# Patient Record
Sex: Female | Born: 1995 | Race: White | Hispanic: No | Marital: Single | Smoking: Current every day smoker
Health system: Southern US, Community
[De-identification: ages and names within clinical notes are randomized; demographics above are authoritative.]

## PROBLEM LIST (undated history)

## (undated) DIAGNOSIS — R197 Diarrhea, unspecified: Secondary | ICD-10-CM

## (undated) DIAGNOSIS — F329 Major depressive disorder, single episode, unspecified: Secondary | ICD-10-CM

## (undated) DIAGNOSIS — K589 Irritable bowel syndrome without diarrhea: Secondary | ICD-10-CM

## (undated) DIAGNOSIS — F32A Depression, unspecified: Secondary | ICD-10-CM

## (undated) DIAGNOSIS — F419 Anxiety disorder, unspecified: Secondary | ICD-10-CM

## (undated) HISTORY — DX: Anxiety disorder, unspecified: F41.9

## (undated) HISTORY — DX: Major depressive disorder, single episode, unspecified: F32.9

## (undated) HISTORY — DX: Irritable bowel syndrome without diarrhea: K58.9

## (undated) HISTORY — DX: Depression, unspecified: F32.A

## (undated) HISTORY — DX: Diarrhea, unspecified: R19.7

## (undated) HISTORY — DX: Irritable bowel syndrome, unspecified: K58.9

## (undated) HISTORY — PX: TONSILLECTOMY: SUR1361

---
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
## 2015-08-12 ENCOUNTER — Encounter: Payer: Self-pay | Admitting: Emergency Medicine

## 2015-08-12 ENCOUNTER — Emergency Department
Admission: EM | Admit: 2015-08-12 | Discharge: 2015-08-12 | Disposition: A | Discharge status: Home / Self Care | Attending: Emergency Medicine | Admitting: Emergency Medicine

## 2015-08-12 ENCOUNTER — Emergency Department

## 2015-08-12 DIAGNOSIS — S4992XA Unspecified injury of left shoulder and upper arm, initial encounter: Secondary | ICD-10-CM | POA: Diagnosis not present

## 2015-08-12 DIAGNOSIS — Y998 Other external cause status: Secondary | ICD-10-CM | POA: Insufficient documentation

## 2015-08-12 DIAGNOSIS — Z793 Long term (current) use of hormonal contraceptives: Secondary | ICD-10-CM | POA: Diagnosis not present

## 2015-08-12 DIAGNOSIS — Z880 Allergy status to penicillin: Secondary | ICD-10-CM | POA: Insufficient documentation

## 2015-08-12 DIAGNOSIS — S20212A Contusion of left front wall of thorax, initial encounter: Secondary | ICD-10-CM | POA: Diagnosis not present

## 2015-08-12 DIAGNOSIS — S161XXA Strain of muscle, fascia and tendon at neck level, initial encounter: Secondary | ICD-10-CM | POA: Diagnosis not present

## 2015-08-12 DIAGNOSIS — F419 Anxiety disorder, unspecified: Secondary | ICD-10-CM | POA: Insufficient documentation

## 2015-08-12 DIAGNOSIS — Z7952 Long term (current) use of systemic steroids: Secondary | ICD-10-CM | POA: Insufficient documentation

## 2015-08-12 DIAGNOSIS — Z79899 Other long term (current) drug therapy: Secondary | ICD-10-CM | POA: Insufficient documentation

## 2015-08-12 DIAGNOSIS — S199XXA Unspecified injury of neck, initial encounter: Secondary | ICD-10-CM | POA: Diagnosis present

## 2015-08-12 DIAGNOSIS — S79912A Unspecified injury of left hip, initial encounter: Secondary | ICD-10-CM | POA: Diagnosis not present

## 2015-08-12 DIAGNOSIS — Y9389 Activity, other specified: Secondary | ICD-10-CM | POA: Diagnosis not present

## 2015-08-12 DIAGNOSIS — F329 Major depressive disorder, single episode, unspecified: Secondary | ICD-10-CM | POA: Insufficient documentation

## 2015-08-12 DIAGNOSIS — Y92481 Parking lot as the place of occurrence of the external cause: Secondary | ICD-10-CM | POA: Insufficient documentation

## 2015-08-12 MED ORDER — TRAMADOL HCL 50 MG PO TABS
50.0000 mg | ORAL_TABLET | Freq: Once | ORAL | Status: AC
  Administered 2015-08-12: 50 mg via ORAL
  Filled 2015-08-12: qty 1

## 2015-08-12 MED ORDER — IBUPROFEN 800 MG PO TABS: 800.0000 mg | ORAL_TABLET | Freq: Three times a day (TID) | ORAL | Status: DC | PRN

## 2015-08-12 MED ORDER — IBUPROFEN 800 MG PO TABS
800.0000 mg | ORAL_TABLET | Freq: Once | ORAL | Status: AC
  Administered 2015-08-12: 800 mg via ORAL
  Filled 2015-08-12: qty 1

## 2015-08-12 MED ORDER — TRAMADOL HCL 50 MG PO TABS: 50.0000 mg | ORAL_TABLET | Freq: Four times a day (QID) | ORAL | Status: DC | PRN

## 2015-08-12 MED ORDER — CYCLOBENZAPRINE HCL 10 MG PO TABS: 10.0000 mg | ORAL_TABLET | Freq: Three times a day (TID) | ORAL | Status: DC | PRN
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
## 2015-08-12 NOTE — ED Notes (Signed)
AAOx3.  Skin warm and dry.  Ambulates with easy and steady.  Moving all extremities equally and strong.  NAD
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
## 2015-08-12 NOTE — ED Notes (Signed)
Patient transported to X-ray 

## 2015-08-16 ENCOUNTER — Encounter: Payer: Self-pay | Admitting: Emergency Medicine

## 2015-08-16 ENCOUNTER — Emergency Department
Admission: EM | Admit: 2015-08-16 | Discharge: 2015-08-16 | Disposition: A | Discharge status: Home / Self Care | Attending: Emergency Medicine | Admitting: Emergency Medicine

## 2015-08-16 ENCOUNTER — Emergency Department

## 2015-08-16 DIAGNOSIS — S20212A Contusion of left front wall of thorax, initial encounter: Secondary | ICD-10-CM | POA: Diagnosis not present

## 2015-08-16 DIAGNOSIS — Z793 Long term (current) use of hormonal contraceptives: Secondary | ICD-10-CM | POA: Insufficient documentation

## 2015-08-16 DIAGNOSIS — Z7952 Long term (current) use of systemic steroids: Secondary | ICD-10-CM | POA: Diagnosis not present

## 2015-08-16 DIAGNOSIS — S7002XA Contusion of left hip, initial encounter: Secondary | ICD-10-CM | POA: Diagnosis not present

## 2015-08-16 DIAGNOSIS — Z791 Long term (current) use of non-steroidal anti-inflammatories (NSAID): Secondary | ICD-10-CM | POA: Insufficient documentation

## 2015-08-16 DIAGNOSIS — Y9389 Activity, other specified: Secondary | ICD-10-CM | POA: Insufficient documentation

## 2015-08-16 DIAGNOSIS — Y998 Other external cause status: Secondary | ICD-10-CM | POA: Diagnosis not present

## 2015-08-16 DIAGNOSIS — S0990XA Unspecified injury of head, initial encounter: Secondary | ICD-10-CM | POA: Insufficient documentation

## 2015-08-16 DIAGNOSIS — Z720 Tobacco use: Secondary | ICD-10-CM | POA: Insufficient documentation

## 2015-08-16 DIAGNOSIS — S299XXA Unspecified injury of thorax, initial encounter: Secondary | ICD-10-CM | POA: Diagnosis present

## 2015-08-16 DIAGNOSIS — S29012A Strain of muscle and tendon of back wall of thorax, initial encounter: Secondary | ICD-10-CM | POA: Insufficient documentation

## 2015-08-16 DIAGNOSIS — Z880 Allergy status to penicillin: Secondary | ICD-10-CM | POA: Diagnosis not present

## 2015-08-16 DIAGNOSIS — S29019A Strain of muscle and tendon of unspecified wall of thorax, initial encounter: Secondary | ICD-10-CM

## 2015-08-16 DIAGNOSIS — Y9289 Other specified places as the place of occurrence of the external cause: Secondary | ICD-10-CM | POA: Diagnosis not present

## 2015-08-16 DIAGNOSIS — Z79899 Other long term (current) drug therapy: Secondary | ICD-10-CM | POA: Insufficient documentation

## 2015-08-16 MED ORDER — TRAMADOL HCL 50 MG PO TABS
100.0000 mg | ORAL_TABLET | Freq: Once | ORAL | Status: AC
  Administered 2015-08-16: 100 mg via ORAL

## 2015-08-16 MED ORDER — TRAMADOL HCL 50 MG PO TABS
ORAL_TABLET | ORAL | Status: AC
  Administered 2015-08-16: 100 mg via ORAL
  Filled 2015-08-16: qty 2
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
## 2015-08-16 NOTE — ED Notes (Signed)
Pt returned from xray

## 2015-08-16 NOTE — ED Notes (Signed)
Pt taken to xray 

## 2015-08-31 ENCOUNTER — Ambulatory Visit: Payer: Self-pay | Admitting: Family Medicine

## 2016-01-16 ENCOUNTER — Encounter (HOSPITAL_COMMUNITY): Payer: Self-pay | Admitting: Emergency Medicine

## 2016-01-16 ENCOUNTER — Emergency Department (HOSPITAL_COMMUNITY): Payer: Self-pay

## 2016-01-16 ENCOUNTER — Emergency Department (HOSPITAL_COMMUNITY)
Admission: EM | Admit: 2016-01-16 | Discharge: 2016-01-16 | Disposition: A | Discharge status: Home / Self Care | Payer: Self-pay | Attending: Emergency Medicine | Admitting: Emergency Medicine

## 2016-01-16 DIAGNOSIS — F17210 Nicotine dependence, cigarettes, uncomplicated: Secondary | ICD-10-CM | POA: Insufficient documentation

## 2016-01-16 DIAGNOSIS — F329 Major depressive disorder, single episode, unspecified: Secondary | ICD-10-CM | POA: Insufficient documentation

## 2016-01-16 DIAGNOSIS — J189 Pneumonia, unspecified organism: Secondary | ICD-10-CM | POA: Insufficient documentation

## 2016-01-16 MED ORDER — ALBUTEROL SULFATE HFA 108 (90 BASE) MCG/ACT IN AERS
2.0000 | INHALATION_SPRAY | Freq: Once | RESPIRATORY_TRACT | Status: AC
  Administered 2016-01-16: 2 via RESPIRATORY_TRACT
  Filled 2016-01-16: qty 6.7

## 2016-01-16 MED ORDER — HYDROCOD POLST-CPM POLST ER 10-8 MG/5ML PO SUER
5.0000 mL | Freq: Once | ORAL | Status: AC
Start: 2016-01-16 — End: 2016-01-16
  Administered 2016-01-16: 5 mL via ORAL
  Filled 2016-01-16: qty 5

## 2016-01-16 MED ORDER — HYDROCODONE-HOMATROPINE 5-1.5 MG/5ML PO SYRP: 5.0000 mL | ORAL_SOLUTION | Freq: Four times a day (QID) | ORAL | Status: DC | PRN

## 2016-01-16 MED ORDER — CIPROFLOXACIN HCL 500 MG PO TABS: 500.0000 mg | ORAL_TABLET | Freq: Two times a day (BID) | ORAL | Status: DC

## 2016-01-16 MED ORDER — CIPROFLOXACIN HCL 250 MG PO TABS
500.0000 mg | ORAL_TABLET | Freq: Once | ORAL | Status: AC
  Administered 2016-01-16: 500 mg via ORAL
  Filled 2016-01-16: qty 2

## 2016-01-16 MED ORDER — PROMETHAZINE HCL 12.5 MG PO TABS
12.5000 mg | ORAL_TABLET | Freq: Once | ORAL | Status: AC
  Administered 2016-01-16: 12.5 mg via ORAL
  Filled 2016-01-16: qty 1

## 2016-01-16 MED ORDER — DEXAMETHASONE 4 MG PO TABS: 4.0000 mg | ORAL_TABLET | Freq: Two times a day (BID) | ORAL | Status: DC

## 2016-01-16 MED ORDER — PREDNISONE 50 MG PO TABS
60.0000 mg | ORAL_TABLET | Freq: Once | ORAL | Status: AC
  Administered 2016-01-16: 60 mg via ORAL
  Filled 2016-01-16: qty 1

## 2016-01-16 MED ORDER — IPRATROPIUM-ALBUTEROL 0.5-2.5 (3) MG/3ML IN SOLN
3.0000 mL | Freq: Once | RESPIRATORY_TRACT | Status: AC
  Administered 2016-01-16: 3 mL via RESPIRATORY_TRACT
  Filled 2016-01-16: qty 3

## 2016-01-16 NOTE — ED Notes (Signed)
Pt states she has had a cough with yellow sputum intermittently for a month.

## 2016-01-16 NOTE — Discharge Instructions (Signed)
Your x-ray reveals left lung pneumonia. Please refrain from all smoking. Use Cipro and Decadron 2 times daily with food until all taken. Use 2 puffs of albuterol every 4 hours. Use Hycodan for cough. This medication may cause drowsiness, please use with caution. Please use Tylenol or ibuprofen for fever and general aching. Community-Acquired Pneumonia, Adult Pneumonia is an infection of the lungs. One type of pneumonia can happen while a person is in a hospital. A different type can happen when a person is not in a hospital (community-acquired pneumonia). It is easy for this kind to spread from person to person. It can spread to you if you breathe near an infected person who coughs or sneezes. Some symptoms include:  A dry cough.  A wet (productive) cough.  Fever.  Sweating.  Chest pain. HOME CARE  Take over-the-counter and prescription medicines only as told by your doctor.  Only take cough medicine if you are losing sleep.  If you were prescribed an antibiotic medicine, take it as told by your doctor. Do not stop taking the antibiotic even if you start to feel better.  Sleep with your head and neck raised (elevated). You can do this by putting a few pillows under your head, or you can sleep in a recliner.  Do not use tobacco products. These include cigarettes, chewing tobacco, and e-cigarettes. If you need help quitting, ask your doctor.  Drink enough water to keep your pee (urine) clear or pale yellow. A shot (vaccine) can help prevent pneumonia. Shots are often suggested for:  People older than 20 years of age.  People older than 20 years of age:  Who are having cancer treatment.  Who have long-term (chronic) lung disease.  Who have problems with their body's defense system (immune system). You may also prevent pneumonia if you take these actions:  Get the flu (influenza) shot every year.  Go to the dentist as often as told.  Wash your hands often. If soap and water are  not available, use hand sanitizer. GET HELP IF:  You have a fever.  You lose sleep because your cough medicine does not help. GET HELP RIGHT AWAY IF:  You are short of breath and it gets worse.  You have more chest pain.  Your sickness gets worse. This is very serious if:  You are an older adult.  Your body's defense system is weak.  You cough up blood.   This information is not intended to replace advice given to you by your health care provider. Make sure you discuss any questions you have with your health care provider.   Document Released: 03/31/2008 Document Revised: 07/04/2015 Document Reviewed: 02/07/2015 Elsevier Interactive Patient Education [employer]2016 Elsevier Inc.

## 2016-01-16 NOTE — ED Provider Notes (Signed)
CSN: [id number]     Arrival date & time 01/16/16  1727 History   First MD Initiated Contact with Patient 01/16/16 1945     Chief Complaint  Patient presents with  . Cough     (Consider location/radiation/quality/duration/timing/severity/associated sxs/prior Treatment) HPI Comments: Patient is a 20 year old female who presents to the emergency department with complaint of productive cough and left rib area pain.  The patient states that she has been sick off and on for months. In the last 3 days she has been coughing to the extent of having left rib area pain. She states she feels as though she has something "stabbing her" in the left ribs and left side. She has a productive cough with yellow phlegm. She denies any hemoptysis. She has not had any operations or procedures involving the lungs.  Patient is a 20 y.o. female presenting with cough. The history is provided by the patient.  Cough Associated symptoms: chills   Associated symptoms: no rash     Past Medical History  Diagnosis Date  . Migraine   . IBS (irritable bowel syndrome)   . Diarrhea   . Anxiety   . Depression    Past Surgical History  Procedure Laterality Date  . Tonsillectomy     History reviewed. No pertinent family history. Social History  Substance Use Topics  . Smoking status: Current Every Day Smoker -- 0.50 packs/day    Types: Cigarettes  . Smokeless tobacco: None  . Alcohol Use: No   OB History    No data available     Review of Systems  Constitutional: Positive for chills.  HENT: Positive for congestion.   Respiratory: Positive for cough.        Chest wall pain  Skin: Negative for rash.  All other systems reviewed and are negative.     Allergies  Other; Tamiflu; Bee venom; Mycifradin sulfate; Penicillins; Zithromax; and Benadryl  Home Medications   Prior to Admission medications   Medication Sig Start Date End Date Taking? Authorizing Provider  acetaminophen-codeine (TYLENOL #3)  300-30 MG per tablet Take 1-2 tablets by mouth every 6 (six) hours as needed. 11/24/14   Ivery Quale, PA-C  albuterol (PROVENTIL HFA;VENTOLIN HFA) 108 (90 BASE) MCG/ACT inhaler Inhale 2 puffs into the lungs every 6 (six) hours as needed. For allergy attacks    Historical Provider, MD  betamethasone dipropionate (DIPROLENE) 0.05 % cream Apply topically 2 (two) times daily.    Historical Provider, MD  ciprofloxacin (CIPRO) 500 MG tablet Take 500 mg by mouth 2 (two) times daily. 7 DAY COURSE STARTING ON 11/14/2014 11/14/14   Historical Provider, MD  cyclobenzaprine (FLEXERIL) 10 MG tablet Take 1 tablet (10 mg total) by mouth every 8 (eight) hours as needed for muscle spasms. 08/12/15   Joni Reining, PA-C  ibuprofen (ADVIL,MOTRIN) 800 MG tablet Take 1 tablet (800 mg total) by mouth 3 (three) times daily. 11/24/14   Ivery Quale, PA-C  ibuprofen (ADVIL,MOTRIN) 800 MG tablet Take 1 tablet (800 mg total) by mouth every 8 (eight) hours as needed for moderate pain. 08/12/15   Joni Reining, PA-C  methocarbamol (ROBAXIN) 500 MG tablet Take 1 tablet (500 mg total) by mouth 3 (three) times daily. 11/24/14   Ivery Quale, PA-C  traMADol (ULTRAM) 50 MG tablet Take 1 tablet (50 mg total) by mouth every 6 (six) hours as needed for moderate pain. 08/12/15   Joni Reining, PA-C  TRINESSA, 28, 0.18/0.215/0.25 MG-35 MCG tablet Take 1 tablet by  mouth every evening. 11/06/14   Historical Provider, MD   BP 111/67 mmHg  Pulse 89  Temp(Src) 98.3 F (36.8 C) (Oral)  Resp 18  Ht 5\' 1"  (1.549 m)  Wt 61.236 kg  BMI 25.52 kg/m2  SpO2 98%  LMP 01/08/2016 Physical Exam  Constitutional: She is oriented to person, place, and time. She appears well-developed and well-nourished.  Non-toxic appearance.  HENT:  Head: Normocephalic.  Right Ear: Tympanic membrane and external ear normal.  Left Ear: Tympanic membrane and external ear normal.  Eyes: EOM and lids are normal. Pupils are equal, round, and reactive to light.  Neck:  Normal range of motion. Neck supple. Carotid bruit is not present.  Cardiovascular: Normal rate, regular rhythm, normal heart sounds, intact distal pulses and normal pulses.   Pulmonary/Chest: Breath sounds normal. No respiratory distress. She exhibits tenderness.  Left chest wall pain to palpation and with deep breathing.  Course breath sounds with a few scattered rhonchi present. There is symmetrical rise and fall of the chest. The patient speaks in complete sentences.  Abdominal: Soft. Bowel sounds are normal. There is no tenderness. There is no guarding.  Musculoskeletal: Normal range of motion.  Lymphadenopathy:       Head (right side): No submandibular adenopathy present.       Head (left side): No submandibular adenopathy present.    She has no cervical adenopathy.  Neurological: She is alert and oriented to person, place, and time. She has normal strength. No cranial nerve deficit or sensory deficit.  Skin: Skin is warm and dry. No rash noted.  Psychiatric: She has a normal mood and affect. Her speech is normal.  Nursing note and vitals reviewed.   ED Course  Procedures (including critical care time) Labs Review Labs Reviewed - No data to display  Imaging Review Dg Chest 2 View  01/16/2016  CLINICAL DATA:  Cough, chills, body aches EXAM: CHEST  2 VIEW COMPARISON:  08/12/2015 FINDINGS: Patchy left lower lobe opacity, suspicious for pneumonia. No pleural effusion or pneumothorax. The heart is normal in size. Visualized osseous structures are within normal limits. IMPRESSION: Left lower lobe pneumonia. Electronically Signed   By: Charline BillsSriyesh  Krishnan M.D.   On: 01/16/2016 18:05   I have personally reviewed and evaluated these images and lab results as part of my medical decision-making.   EKG Interpretation None      MDM  Chest x-ray suggested a left lower lobe pneumonia. Pulse oximetry is 100% on room air. The patient speaks in complete sentences. There is frequent coughing  noted.  The plan at this time includes Cipro 500 mg twice a day. The patient has allergy to several of the antibiotics usually used for community-acquired pneumonia. A prescription for Hycodan for cough, Decadron 2 times daily, and albuterol inhaler 2 puffs every 4 hours been given to the patient.    Final diagnoses:  None    **I have reviewed nursing notes, vital signs, and all appropriate lab and imaging results for this patient.Ivery Quale*    Delos Klich, PA-C 01/16/16 2011  Donnetta HutchingBrian Cook, MD 01/19/16 1056
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
## 2017-05-05 ENCOUNTER — Encounter (HOSPITAL_COMMUNITY): Payer: Self-pay

## 2017-05-05 ENCOUNTER — Emergency Department (HOSPITAL_COMMUNITY)
Admission: EM | Admit: 2017-05-05 | Discharge: 2017-05-05 | Disposition: A | Discharge status: Home / Self Care | Attending: Emergency Medicine | Admitting: Emergency Medicine

## 2017-05-05 DIAGNOSIS — Y999 Unspecified external cause status: Secondary | ICD-10-CM | POA: Insufficient documentation

## 2017-05-05 DIAGNOSIS — Y92007 Garden or yard of unspecified non-institutional (private) residence as the place of occurrence of the external cause: Secondary | ICD-10-CM | POA: Insufficient documentation

## 2017-05-05 DIAGNOSIS — Y93H2 Activity, gardening and landscaping: Secondary | ICD-10-CM | POA: Insufficient documentation

## 2017-05-05 DIAGNOSIS — S0501XA Injury of conjunctiva and corneal abrasion without foreign body, right eye, initial encounter: Secondary | ICD-10-CM

## 2017-05-05 DIAGNOSIS — X58XXXA Exposure to other specified factors, initial encounter: Secondary | ICD-10-CM | POA: Insufficient documentation

## 2017-05-05 DIAGNOSIS — F17210 Nicotine dependence, cigarettes, uncomplicated: Secondary | ICD-10-CM | POA: Insufficient documentation

## 2017-05-05 MED ORDER — KETOROLAC TROMETHAMINE 0.5 % OP SOLN
1.0000 [drp] | Freq: Once | OPHTHALMIC | Status: AC
  Administered 2017-05-05: 1 [drp] via OPHTHALMIC
  Filled 2017-05-05: qty 5

## 2017-05-05 MED ORDER — TETRACAINE HCL 0.5 % OP SOLN
2.0000 [drp] | Freq: Once | OPHTHALMIC | Status: AC
  Administered 2017-05-05: 2 [drp] via OPHTHALMIC
  Filled 2017-05-05: qty 4

## 2017-05-05 MED ORDER — FLUORESCEIN SODIUM 0.6 MG OP STRP
1.0000 | ORAL_STRIP | Freq: Once | OPHTHALMIC | Status: AC
  Administered 2017-05-05: 1 via OPHTHALMIC
  Filled 2017-05-05: qty 1

## 2017-05-05 MED ORDER — SULFACETAMIDE SODIUM 10 % OP SOLN
1.0000 [drp] | OPHTHALMIC | Status: DC
  Administered 2017-05-05: 1 [drp] via OPHTHALMIC
  Filled 2017-05-05: qty 15

## 2017-05-05 NOTE — ED Notes (Signed)
Pt attempted visual acuity but only able to see "E"  20/200. Pt states she wears glasses and does not have them with her.

## 2017-05-05 NOTE — ED Triage Notes (Signed)
Pt reports that she got dirt in right eye yesterday. Started watering yesterday. Right eye swollen this morning and painful to open

## 2017-05-05 NOTE — Discharge Instructions (Signed)
One drop of the ketorolac 4 times a day and 1 drop of the antibiotic every 3 hrs while awake for 7 days.  Avoid eye strain and do not put your contacts in for at least 2 weeks or until approved to do so by your eye doctor.

## 2017-05-05 NOTE — ED Notes (Signed)
Right eye flushed with NS. Pt tolerated well.

## 2017-05-05 NOTE — ED Provider Notes (Signed)
[facility]-EMERGENCY DEPT Provider Note   CSN: [id number] Arrival date & time: 05/05/17  0736     History   Chief Complaint Chief Complaint  Patient presents with  . Eye Pain    HPI Monique Griffith is a [age] y.o. female.  HPI   Monique Griffith is a 21 y.o. female who presents to the Emergency Department complaining of Pain and irritation of the right. She states that she was working outside yesterday and believes she got some dirt in her eye. She describes pain to the inner right eye. Pain is worse with blinking. She also reports excessive tearing of her eye and photophobia. She is irrigated her eye with tap water and applied over-the-counter eyedrops without relief. She notes some swelling of her upper eyelid.  Patient states that she does wear contacts but removed her contact after she developed symptoms. She denies headache, dizziness, nausea or vomiting, facial pain or swelling or recent illness.    Past Medical History:  Diagnosis Date  . Anxiety   . Depression   . Diarrhea   . IBS (irritable bowel syndrome)   . Migraine     Patient Active Problem List   Diagnosis Date Noted  . IBS (irritable bowel syndrome)   . Diarrhea     Past Surgical History:  Procedure Laterality Date  . TONSILLECTOMY      OB History    No data available       Home Medications    Prior to Admission medications   Not on File    Family History No family history on file.  Social History Social History  Substance Use Topics  . Smoking status: Current Every Day Smoker    Packs/day: 0.50    Types: Cigarettes  . Smokeless tobacco: Not on file  . Alcohol use No     Allergies   Other; Tamiflu [oseltamivir phosphate]; Bee venom; Mycifradin sulfate [neomycin sulfate]; Penicillins; Zithromax [azithromycin dihydrate]; and Benadryl [diphenhydramine hcl (sleep)]   Review of Systems Review of Systems  Constitutional: Negative for chills and fever.  HENT: Negative for congestion, facial  swelling, sneezing and sore throat.   Eyes: Positive for photophobia, pain, redness and visual disturbance. Negative for discharge and itching.  Respiratory: Negative for cough and shortness of breath.   Cardiovascular: Negative for chest pain.  Skin: Negative for rash.  Neurological: Negative for dizziness, syncope, light-headedness, numbness and headaches.  Psychiatric/Behavioral: Negative for confusion.     Physical Exam Updated Vital Signs BP 109/79 (BP Location: Right Arm)   Pulse 82   Temp 97.9 F (36.6 C) (Oral)   Resp 16   Ht 5\' 2"  (1.575 m)   Wt 69.4 kg (153 lb)   LMP 04/19/2017   SpO2 100%   BMI 27.98 kg/m   Physical Exam  Constitutional: She is oriented to person, place, and time. She appears well-developed and well-nourished. No distress.  HENT:  Head: Atraumatic.  Mouth/Throat: Oropharynx is clear and moist.  Eyes: EOM are normal. Pupils are equal, round, and reactive to light. Lids are everted and swept, no foreign bodies found. Right eye exhibits no chemosis, no discharge, no exudate and no hordeolum. No foreign body present in the right eye. Right conjunctiva is injected.  Fundoscopic exam:      The right eye shows no exudate, no hemorrhage and no papilledema.  Slit lamp exam:      The right eye shows corneal abrasion and fluorescein uptake. The right eye shows no corneal ulcer,  no foreign body and no hyphema.    2 small corneal abrasions of the right eye. No foreign bodies.  Neck: Normal range of motion. Neck supple.  Cardiovascular: Normal rate and regular rhythm.   Pulmonary/Chest: Effort normal and breath sounds normal.  Musculoskeletal: Normal range of motion.  Neurological: She is oriented to person, place, and time. No sensory deficit.  Skin: Skin is warm.  Psychiatric: She has a normal mood and affect.  Nursing note and vitals reviewed.    ED Treatments / Results  Labs (all labs ordered are listed, but only abnormal results are  displayed) Labs Reviewed - No data to display  EKG  EKG Interpretation None       Radiology No results found.  Procedures Procedures (including critical care time)  Medications Ordered in ED Medications  tetracaine (PONTOCAINE) 0.5 % ophthalmic solution 2 drop (2 drops Right Eye Given by Other 05/05/17 1610)  fluorescein ophthalmic strip 1 strip (1 strip Right Eye Given 05/05/17 0938)       Visual Acuity  Right Eye Distance: 20/200 Left Eye Distance: 20/200 Bilateral Distance: (S) 20/200 (pt states she wears glasses and does not have them with her. )    Initial Impression / Assessment and Plan / ED Course  I have reviewed the triage vital signs and the nursing notes.  Pertinent labs & imaging results that were available during my care of the patient were reviewed by me and considered in my medical decision making (see chart for details).     Patient with 2 small corneal abrasions of the right eye. No foreign bodies. Patient advised to not wear her contacts for at least 2 weeks or until wise to do so by her ophthalmologist. Sulfacetamide and ketorolac eyedrops were dispensed. Patient appears stable for discharge and agrees to treatment plan.   Final Clinical Impressions(s) / ED Diagnoses   Final diagnoses:  Abrasion of right cornea, initial encounter    New Prescriptions New Prescriptions   No medications on file     Pauline Aus, PA-C 05/05/17 1012    Pauline Aus, PA-C 05/05/17 1013    Bethann Berkshire, MD 05/05/17 1432
